# Patient Record
Sex: Female | Born: 1955 | Race: Black or African American | Hispanic: No | Smoking: Never smoker
Health system: Southern US, Community
[De-identification: ages and names within clinical notes are randomized; demographics above are authoritative.]

## PROBLEM LIST (undated history)

## (undated) HISTORY — PX: ABDOMINAL HYSTERECTOMY: SHX81

---

## 2009-04-17 ENCOUNTER — Emergency Department (HOSPITAL_BASED_OUTPATIENT_CLINIC_OR_DEPARTMENT_OTHER): Admission: EM | Admit: 2009-04-17 | Discharge: 2009-04-17 | Payer: Self-pay | Admitting: Emergency Medicine

## 2009-04-23 ENCOUNTER — Emergency Department (HOSPITAL_COMMUNITY): Admission: EM | Admit: 2009-04-23 | Discharge: 2009-04-23 | Payer: Self-pay | Admitting: Emergency Medicine

## 2009-12-09 ENCOUNTER — Emergency Department (HOSPITAL_COMMUNITY): Admission: EM | Admit: 2009-12-09 | Discharge: 2009-12-09 | Payer: Self-pay | Admitting: Emergency Medicine

## 2010-07-16 IMAGING — CR DG PELVIS 1-2V
1 series · 1 of 1 positions shown · non-contrast
Comparison: None

CLINICAL DATA: Chronic pelvic and lower back pain.

PELVIS - 1-2 VIEW

[t pelvis a.p.]
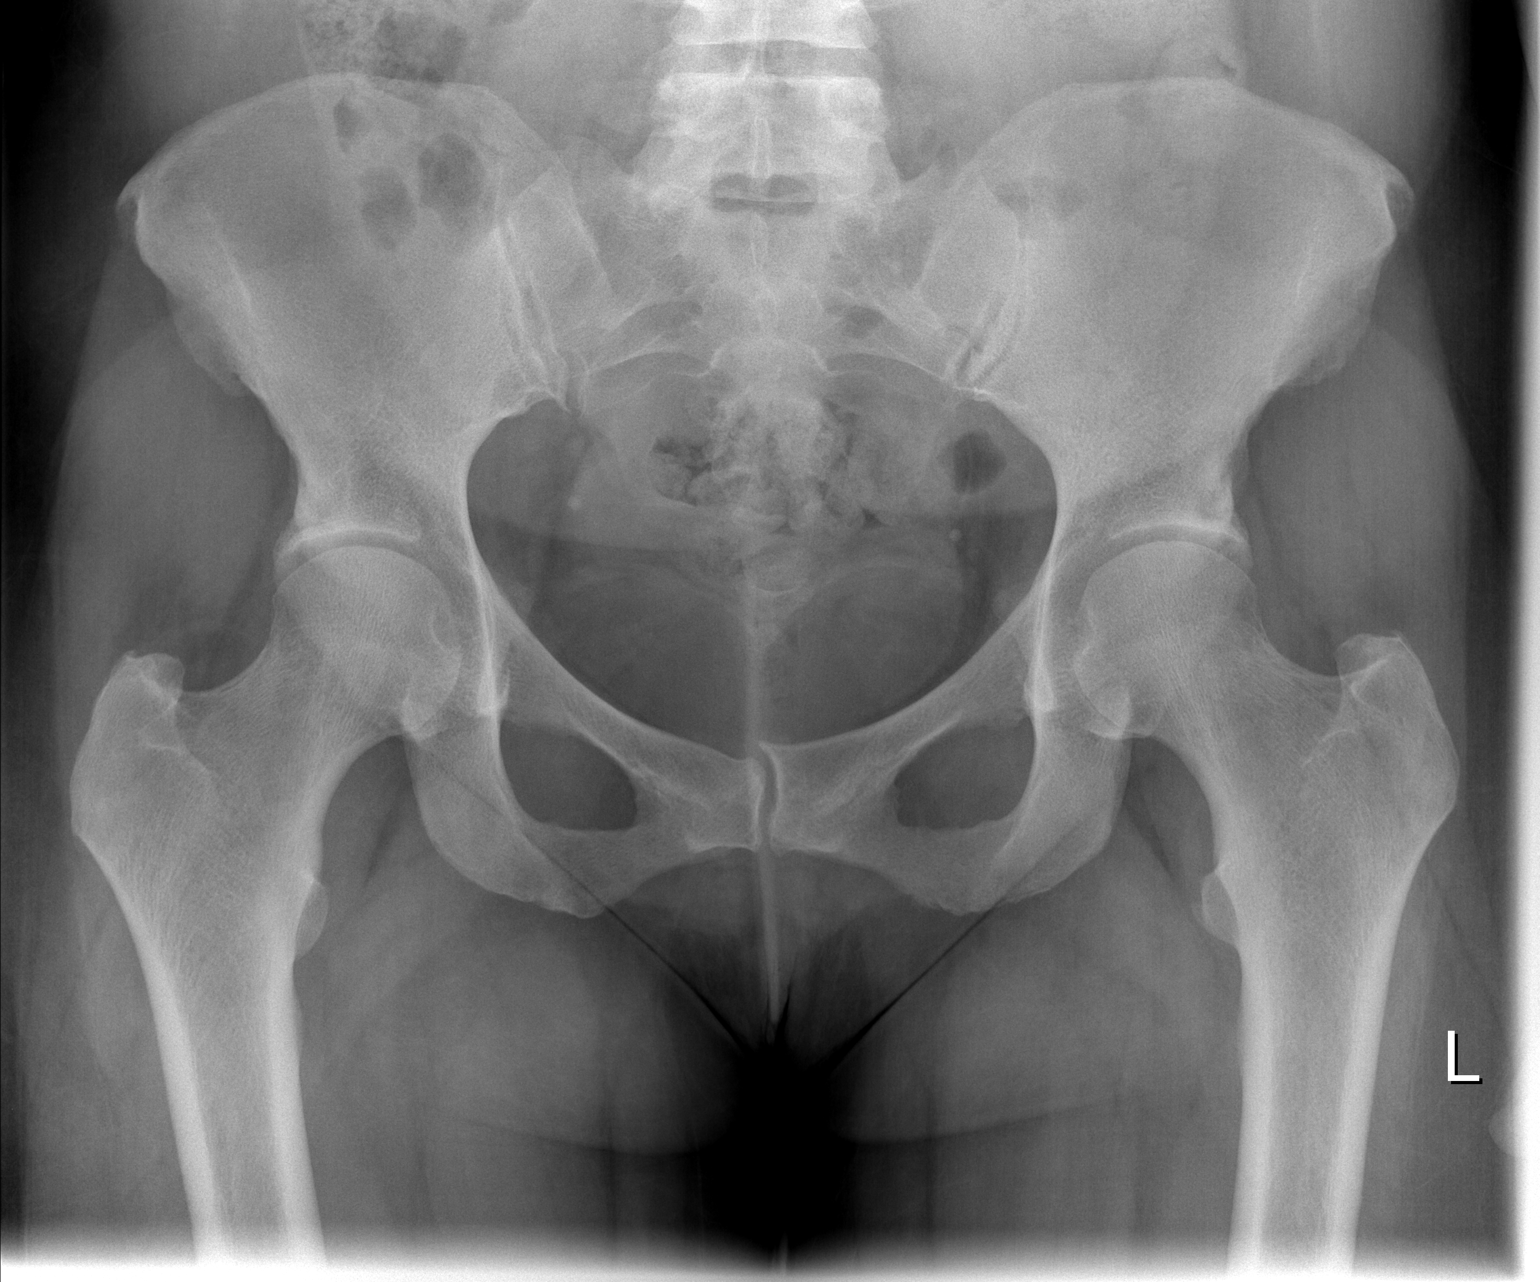

[1 of 1 positions shown; findings below may reference images not displayed]

FINDINGS: There is no evidence of fracture or dislocation.  Both
femoral heads are seated normally within their respective
acetabula.  The sacroiliac joints are unremarkable in appearance.

The visualized bowel gas pattern is grossly unremarkable in
appearance.  Scattered phleboliths are noted within the pelvis.
IMPRESSION: No evidence of fracture or dislocation.

## 2010-11-09 LAB — URINALYSIS, ROUTINE W REFLEX MICROSCOPIC
Bilirubin Urine: NEGATIVE
Glucose, UA: NEGATIVE mg/dL
Hgb urine dipstick: NEGATIVE
Ketones, ur: NEGATIVE mg/dL
Nitrite: NEGATIVE
Nitrite: NEGATIVE
Protein, ur: NEGATIVE mg/dL
Specific Gravity, Urine: 1.022 (ref 1.005–1.030)
pH: 5.5 (ref 5.0–8.0)
pH: 6.5 (ref 5.0–8.0)

## 2010-11-09 LAB — URINE MICROSCOPIC-ADD ON

## 2010-12-16 ENCOUNTER — Emergency Department (HOSPITAL_BASED_OUTPATIENT_CLINIC_OR_DEPARTMENT_OTHER)
Admission: EM | Admit: 2010-12-16 | Discharge: 2010-12-16 | Disposition: A | Discharge status: Home / Self Care | Payer: Self-pay | Attending: Emergency Medicine | Admitting: Emergency Medicine

## 2010-12-16 DIAGNOSIS — T6391XA Toxic effect of contact with unspecified venomous animal, accidental (unintentional), initial encounter: Secondary | ICD-10-CM | POA: Insufficient documentation

## 2010-12-16 DIAGNOSIS — T622X1A Toxic effect of other ingested (parts of) plant(s), accidental (unintentional), initial encounter: Secondary | ICD-10-CM | POA: Insufficient documentation

## 2014-01-03 ENCOUNTER — Encounter (HOSPITAL_COMMUNITY): Payer: Self-pay | Admitting: Emergency Medicine

## 2014-01-03 ENCOUNTER — Emergency Department (HOSPITAL_COMMUNITY): Payer: BC Managed Care – PPO

## 2014-01-03 ENCOUNTER — Emergency Department (HOSPITAL_COMMUNITY)
Admission: EM | Admit: 2014-01-03 | Discharge: 2014-01-03 | Disposition: A | Discharge status: Home / Self Care | Payer: BC Managed Care – PPO | Attending: Emergency Medicine | Admitting: Emergency Medicine

## 2014-01-03 DIAGNOSIS — M51369 Other intervertebral disc degeneration, lumbar region without mention of lumbar back pain or lower extremity pain: Secondary | ICD-10-CM

## 2014-01-03 DIAGNOSIS — IMO0002 Reserved for concepts with insufficient information to code with codable children: Secondary | ICD-10-CM | POA: Insufficient documentation

## 2014-01-03 DIAGNOSIS — M5136 Other intervertebral disc degeneration, lumbar region: Secondary | ICD-10-CM

## 2014-01-03 DIAGNOSIS — Z79899 Other long term (current) drug therapy: Secondary | ICD-10-CM | POA: Insufficient documentation

## 2014-01-03 DIAGNOSIS — M5126 Other intervertebral disc displacement, lumbar region: Secondary | ICD-10-CM

## 2014-01-03 DIAGNOSIS — Z91040 Latex allergy status: Secondary | ICD-10-CM | POA: Insufficient documentation

## 2014-01-03 DIAGNOSIS — M545 Low back pain, unspecified: Secondary | ICD-10-CM

## 2014-01-03 MED ORDER — OXYCODONE-ACETAMINOPHEN 5-325 MG PO TABS: 1.0000 | ORAL_TABLET | Freq: Four times a day (QID) | ORAL | Status: DC | PRN

## 2014-01-03 MED ORDER — METHOCARBAMOL 1000 MG/10ML IJ SOLN
500.0000 mg | Freq: Once | INTRAVENOUS | Status: DC
  Filled 2014-01-03: qty 5

## 2014-01-03 MED ORDER — PREDNISONE 20 MG PO TABS
60.0000 mg | ORAL_TABLET | Freq: Once | ORAL | Status: AC
  Administered 2014-01-03: 60 mg via ORAL
  Filled 2014-01-03: qty 3

## 2014-01-03 MED ORDER — METHOCARBAMOL 1000 MG/10ML IJ SOLN
500.0000 mg | Freq: Once | INTRAMUSCULAR | Status: DC
  Filled 2014-01-03: qty 5

## 2014-01-03 MED ORDER — METHOCARBAMOL 500 MG PO TABS: 500.0000 mg | ORAL_TABLET | Freq: Two times a day (BID) | ORAL | Status: DC

## 2014-01-03 MED ORDER — HYDROMORPHONE HCL PF 1 MG/ML IJ SOLN
1.0000 mg | Freq: Once | INTRAMUSCULAR | Status: AC
Start: 2014-01-03 — End: 2014-01-03
  Administered 2014-01-03: 1 mg via INTRAVENOUS
  Filled 2014-01-03: qty 1

## 2014-01-03 MED ORDER — POLYETHYLENE GLYCOL 3350 17 GM/SCOOP PO POWD: 17.0000 g | Freq: Every day | ORAL | Status: DC

## 2014-01-03 MED ORDER — METHOCARBAMOL 1000 MG/10ML IJ SOLN
500.0000 mg | Freq: Once | INTRAMUSCULAR | Status: AC
  Administered 2014-01-03: 500 mg via INTRAMUSCULAR

## 2014-01-03 MED ORDER — PREDNISONE 10 MG PO TABS: 20.0000 mg | ORAL_TABLET | Freq: Every day | ORAL | Status: DC

## 2014-01-03 NOTE — Discharge Instructions (Signed)
You were seen and evaluated for your low back pain. Your MRI today showed that you have a bulging disc in your lower back. Please use the medications prescribed to help with your symptoms and followup with a primary care provider or neurosurgery specialist for continued evaluation and treatment. Return if you have any changing or worsening symptoms.    Herniated Disk The bones of your spinal column (vertebrae) protect your spinal cord and nerves that go into your arms and legs. The vertebrae are separated by disks that cushion the spinal column and put space between your vertebrae. This allows movement between the vertebrae, which allows you to bend, rotate, and move your body from side to side. Sometimes, the disks move out of place (herniate) or break open (rupture) from injury or strain. The most common area for a disk herniation is in the lower back (lumbar area). Sometimes herniation occurs in the neck (cervical) disks.  CAUSES  As we grow older, the strong, fibrous cords that connect the vertebrae and support and surround the disks (ligaments) start to weaken. A strain on the back may cause a break in the disk ligaments. RISK FACTORS Herniated disks occur most often in men who are aged 18 years to 35 years, usually after strenuous activity. Other risk factors include conditions present at birth (congenital) that affect the size of the lumbar spinal canal. Additionally, a narrowing of the areas where the nerves exit the spinal canal can occur as you age. SYMPTOMS  Symptoms of a herniated disk vary. You may have weakness in certain muscles. This weakness can include difficulty lifting your leg or arm, difficulty standing on your toes on one side, or difficulty squeezing tightly with one of your hands. You may have numbness. You may feel a mild tingling, dull ache, or a burning or pulsating pain. In some cases, the pain is severe enough that you are unable to move. The pain most often occurs on one  side of the body. The pain often starts slowly. It may get worse:  After you sit or stand.  At night.  When you sneeze, cough, or laugh.  When you bend backwards or walk more than a few yards. The pain, numbness, or weakness will often go away or improve a lot over a period of weeks to months. Herniated lumbar disk Symptoms of a herniated lumbar disk may include sharp pain in one part of your leg, hip, or buttocks and numbness in other parts. You also may feel pain or numbness on the back of your calf or the top or sole of your foot. The same leg also may feel weak. Herniated cervical disk Symptoms of a herniated cervical disk may include pain when you move your neck, deep pain near or over your shoulder blade, or pain that moves to your upper arm, forearm, or fingers. DIAGNOSIS  To diagnose a herniated disk, your caregiver will perform a physical exam. Your caregiver also may perform diagnostic tests to see your disk or to test the reaction of your muscles and the function of your nerves. During the physical exam, your caregiver may ask you to:  Sit, stand, and walk. While you walk, your caregiver may ask you to try walking on your toes and then your heels.  Bend forward, backward, and sideways.  Raise your shoulders, elbow, wrist, and fingers and check your strength during these tasks. Your caregiver will check for:  Numbness or loss of feeling.  Muscle reflexes, which may be slower or  missing.  Muscle strength, which may be weaker.  Posture or the way your spine curves. Diagnostic tests that may be done include:  A spinal X-ray exam to rule out other causes of back pain.  Magnetic resonance imaging (MRI) or computed tomography (CT) scan, which will show if the herniated disk is pressing on your spinal canal.  Electromyography. This is sometimes used to identify the specific area of nerve involvement. TREATMENT  Initial treatment for a herniated disk is a short period of  rest with medicines for pain. Pain medicines can include nonsteroidal anti-inflammatory medicines (NSAIDs), muscle relaxants for back spasms, and (rarely) narcotic pain medicine for severe pain that does not respond to NSAID use. Bed rest is often limited to 1 or 2 days at the most because prolonged rest can delay recovery. When the herniation involves the lower back, sitting should be avoided as much as possible because sitting increases pressure on the ruptured disk. Sometimes a soft neck collar will be prescribed for a few days to weeks to help support your neck in the case of a cervical herniation. Physical therapy is often prescribed for patients with disk disease. Physical therapists will teach you how to properly lift, dress, walk, and perform other activities. They will work on strengthening the muscles that help support your spine. In some cases, physical therapy alone is not enough to treat a herniated disk. Steroid injections along the involved nerve root may be needed to help control pain. The steroid is injected in the area of the herniated disk and helps by reducing swelling around the disk. Sometimes surgery is the best option to treat a herniated disk.  SEEK IMMEDIATE MEDICAL CARE IF:   You have numbness, tingling, weakness, or problems with the use of your arms or legs.  You have severe headaches that are not relieved with the use of medicines.  You notice a change in your bowel or bladder control.  You have increasing pain in any areas of your body.  You experience shortness of breath, dizziness, or fainting. MAKE SURE YOU:   Understand these instructions.  Will watch your condition.  Will get help right away if you are not doing well or get worse. Document Released: 07/19/2000 Document Revised: 10/14/2011 Document Reviewed: 02/22/2011 [facility] Patient Information 2014 [place], [place].   Back Pain, Adult Back pain is very common. The pain often gets better over time. The  cause of back pain is usually not dangerous. Most people can learn to manage their back pain on their own.  HOME CARE   Stay active. Start with short walks on flat ground if you can. Try to walk farther each day.  Do not sit, drive, or stand in one place for more than 30 minutes. Do not stay in bed.  Do not avoid exercise or work. Activity can help your back heal faster.  Be careful when you bend or lift an object. Bend at your knees, keep the object close to you, and do not twist.  Sleep on a firm mattress. Lie on your side, and bend your knees. If you lie on your back, put a pillow under your knees.  Only take medicines as told by your doctor.  Put ice on the injured area.  Put ice in a plastic bag.  Place a towel between your skin and the bag.  Leave the ice on for 15-20 minutes, 03-04 times a day for the first 2 to 3 days. After that, you can switch between ice and  heat packs.  Ask your doctor about back exercises or massage.  Avoid feeling anxious or stressed. Find good ways to deal with stress, such as exercise. GET HELP RIGHT AWAY IF:   Your pain does not go away with rest or medicine.  Your pain does not go away in 1 week.  You have new problems.  You do not feel well.  The pain spreads into your legs.  You cannot control when you poop (bowel movement) or pee (urinate).  Your arms or legs feel weak or lose feeling (numbness).  You feel sick to your stomach (nauseous) or throw up (vomit).  You have belly (abdominal) pain.  You feel like you may pass out (faint). MAKE SURE YOU:   Understand these instructions.  Will watch your condition.  Will get help right away if you are not doing well or get worse. Document Released: 01/08/2008 Document Revised: 10/14/2011 Document Reviewed: 12/10/2010 [facility] Patient Information 2014 [place], [place].    Back Exercises Back exercises help treat and prevent back injuries. The goal is to increase your strength in  your belly (abdominal) and back muscles. These exercises can also help with flexibility. Start these exercises when told by your doctor. HOME CARE Back exercises include: Pelvic Tilt.  Lie on your back with your knees bent. Tilt your pelvis until the lower part of your back is against the floor. Hold this position 5 to 10 sec. Repeat this exercise 5 to 10 times. Knee to Chest.  Pull 1 knee up against your chest and hold for 20 to 30 seconds. Repeat this with the other knee. This may be done with the other leg straight or bent, whichever feels better. Then, pull both knees up against your chest. Sit-Ups or Curl-Ups.  Bend your knees 90 degrees. Start with tilting your pelvis, and do a partial, slow sit-up. Only lift your upper half 30 to 45 degrees off the floor. Take at least 2 to 3 seonds for each sit-up. Do not do sit-ups with your knees out straight. If partial sit-ups are difficult, simply do the above but with only tightening your belly (abdominal) muscles and holding it as told. Hip-Lift.  Lie on your back with your knees flexed 90 degrees. Push down with your feet and shoulders as you raise your hips 2 inches off the floor. Hold for 10 seconds, repeat 5 to 10 times. Back Arches.  Lie on your stomach. Prop yourself up on bent elbows. Slowly press on your hands, causing an arch in your low back. Repeat 3 to 5 times. Shoulder-Lifts.  Lie face down with arms beside your body. Keep hips and belly pressed to floor as you slowly lift your head and shoulders off the floor. Do not overdo your exercises. Be careful in the beginning. Exercises may cause you some mild back discomfort. If the pain lasts for more than 15 minutes, stop the exercises until you see your doctor. Improvement with exercise for back problems is slow.  Document Released: 08/24/2010 Document Revised: 10/14/2011 Document Reviewed: 05/23/2011 [facility] Patient Information 2014 [place], [place].

## 2014-01-03 NOTE — ED Notes (Signed)
Pt to ED with family with c/o low back pain, radiating down left hip, started this morning. Difficulty walking, states some incontinence of urine today only.

## 2014-01-03 NOTE — ED Provider Notes (Signed)
Beverly Reynolds 8:00 PM patient discussed in sign out. Patient with worsened low back pain complaining of some difficulty with urinating questionable incontinence versus decreased mobility from pain. Normal rectal tone. No other concerning deficits on exam. MRI pending to rule out cauda equina.  9:30PM pt feeling much better. She ahs been up standing and walking.    The patient also seen and evaluated with attending physician. Dr. Manus Gunning did speak with Dr. Dutch Quint with neurosurgery who has reviewed the MRI results. He does not believe there is any sign of a significant hematoma that the radiologist was questioning. He also feels that the left-sided pain and symptoms the patient is experiencing do not correlate well with the right sided bulging disc. He does not feel there are concerns for nerve impingement on the conus area causing her urinary urgency and dribbling. Patient does also state that she feels she is mostly having difficulty getting to the bathroom due to pain. At this time she is improved and stable to go home. Dr. Dutch Quint did recommend steroids and outpatient followup.    Angus Seller, PA-C 01/03/14 2214

## 2014-01-03 NOTE — ED Provider Notes (Signed)
CSN: [id number]     Arrival date & time 01/03/14  1332 History   First MD Initiated Contact with Patient 01/03/14 1605     Chief Complaint  Patient presents with  . Back Pain     (Consider location/radiation/quality/duration/timing/severity/associated sxs/prior Treatment) HPI Comments: Patient presents with a chief complaint of left lower back pain.  She reports that she began having the pain this morning.  She states that the pain radiates down the left leg.  She reports that she has a history of Sciatica, but reports that the pain she is having at this time is much worse.  She denies any injury or trauma.  She reports that she initially was having some tingling of her left foot for a few minutes, but no numbness or tingling at this time.  She denies any dysuria, hematuria, increased frequency, or urgency.  Denies any fever or chills.  She reports that this morning she did have an episode of urinary incontinence.  However, she reports that this was due to pain.  She states that she had the sensation that she had to urinate, but was walking slower to the bathroom due to the pain and had a small amount of incontinence.  She states that she was unable to hold her urine.  She denies bowel incontinence.  She denies any history of IV drug use or Cancer.  The history is provided by the patient.    History reviewed. No pertinent past medical history. Past Surgical History  Procedure Laterality Date  . Abdominal hysterectomy     No family history on file. History  Substance Use Topics  . Smoking status: Never Smoker   . Smokeless tobacco: Not on file  . Alcohol Use: No   OB History   Grav Para Term Preterm Abortions TAB SAB Ect Mult Living                 Review of Systems  All other systems reviewed and are negative.     Allergies  Latex  Home Medications   Prior to Admission medications   Medication Sig Start Date End Date Taking? Authorizing Provider  Probiotic Product  (PROBIOTIC DAILY PO) Take 1 tablet by mouth daily.   Yes Historical Provider, MD   BP 92/75  Pulse 67  Temp(Src) 98.4 F (36.9 C) (Oral)  Resp 18  SpO2 100% Physical Exam  Nursing note and vitals reviewed. Constitutional: She appears well-developed and well-nourished.  HENT:  Head: Normocephalic and atraumatic.  Mouth/Throat: Oropharynx is clear and moist.  Neck: Normal range of motion. Neck supple.  Cardiovascular: Normal rate, regular rhythm and normal heart sounds.   Pulmonary/Chest: Effort normal and breath sounds normal.  Abdominal: Soft. She exhibits no distension and no mass. There is no tenderness. There is no rebound and no guarding.  Genitourinary:  Normal rectal tone  Musculoskeletal: Normal range of motion.       Cervical back: She exhibits normal range of motion, no tenderness, no bony tenderness, no swelling, no edema and no deformity.       Thoracic back: She exhibits normal range of motion, no tenderness, no bony tenderness, no swelling, no edema and no deformity.       Lumbar back: She exhibits normal range of motion, no tenderness, no bony tenderness, no swelling, no edema and no deformity.  Neurological: She is alert. She has normal strength. No sensory deficit.  Reflex Scores:      Patellar reflexes are 2+ on the right  side and 2+ on the left side. Distal sensation of both feet intact   Skin: Skin is warm and dry. No erythema.  Psychiatric: She has a normal mood and affect.    ED Course  Procedures (including critical care time) Labs Review Labs Reviewed - No data to display  Imaging Review No results found.   EKG Interpretation None     6:15 PM Reassessed patient.  She reports mild improvement in her pain.  Pain worse with movement.  8:00 PM Patient signed out to Ivonne AndrewPeter Dammen, PA-C at shift change.  MRI lumbar spine pending.  MDM   Final diagnoses:  None   Patient presenting with acute onset of lower back pain that she began having this  morning.  She is afebrile.  No injury or trauma.  Distal sensation intact.  Muscle strength normal.  Patient complaining of questionable urinary incontinence.  Therefore, MRI lumbar spine ordered.  Results pending at shift change.      Santiago GladHeather Detrich Rakestraw, PA-C 01/04/14 [phone]  Santiago GladHeather Lilli Dewald, PA-C 01/04/14 [id number]

## 2014-01-04 NOTE — ED Provider Notes (Signed)
Medical screening examination/treatment/procedure(s) were conducted as a shared visit with non-physician practitioner(s) and myself.  I personally evaluated the patient during the encounter.  MRI results d/w Dr. Dutch Quint who viewed images.  R bulging disc is below conus and not cause of L sided symptoms. He does not feel patient's questionable urinary dribbling is related to this lesion.  She has equal strength in her lower extremities and is able to ambulate.    EKG Interpretation None       Glynn Octave, MD 01/04/14 [phone]

## 2014-01-04 NOTE — ED Provider Notes (Signed)
Medical screening examination/treatment/procedure(s) were conducted as a shared visit with non-physician practitioner(s) and myself.  I personally evaluated the patient during the encounter.  See my additional note  EKG Interpretation None        Glynn Octave, MD 01/04/14 1140

## 2015-10-11 ENCOUNTER — Emergency Department (HOSPITAL_BASED_OUTPATIENT_CLINIC_OR_DEPARTMENT_OTHER)
Admission: EM | Admit: 2015-10-11 | Discharge: 2015-10-11 | Disposition: A | Discharge status: Home / Self Care | Payer: BC Managed Care – PPO | Attending: Emergency Medicine | Admitting: Emergency Medicine

## 2015-10-11 ENCOUNTER — Encounter (HOSPITAL_BASED_OUTPATIENT_CLINIC_OR_DEPARTMENT_OTHER): Payer: Self-pay

## 2015-10-11 DIAGNOSIS — K644 Residual hemorrhoidal skin tags: Secondary | ICD-10-CM | POA: Insufficient documentation

## 2015-10-11 DIAGNOSIS — R195 Other fecal abnormalities: Secondary | ICD-10-CM | POA: Insufficient documentation

## 2015-10-11 DIAGNOSIS — K625 Hemorrhage of anus and rectum: Secondary | ICD-10-CM | POA: Diagnosis present

## 2015-10-11 DIAGNOSIS — Z91040 Latex allergy status: Secondary | ICD-10-CM | POA: Diagnosis not present

## 2015-10-11 LAB — CBC WITH DIFFERENTIAL/PLATELET
Basophils Absolute: 0 10*3/uL (ref 0.0–0.1)
Basophils Relative: 0 %
EOS ABS: 0 10*3/uL (ref 0.0–0.7)
EOS PCT: 1 %
HCT: 40.1 % (ref 36.0–46.0)
Hemoglobin: 13.4 g/dL (ref 12.0–15.0)
LYMPHS ABS: 3.1 10*3/uL (ref 0.7–4.0)
Lymphocytes Relative: 37 %
MCH: 26.1 pg (ref 26.0–34.0)
MCHC: 33.4 g/dL (ref 30.0–36.0)
MCV: 78.2 fL (ref 78.0–100.0)
MONO ABS: 0.6 10*3/uL (ref 0.1–1.0)
MONOS PCT: 7 %
Neutro Abs: 4.5 10*3/uL (ref 1.7–7.7)
Neutrophils Relative %: 55 %
PLATELETS: 247 10*3/uL (ref 150–400)
RBC: 5.13 MIL/uL — ABNORMAL HIGH (ref 3.87–5.11)
RDW: 14 % (ref 11.5–15.5)
WBC: 8.3 10*3/uL (ref 4.0–10.5)

## 2015-10-11 LAB — COMPREHENSIVE METABOLIC PANEL
ALT: 17 U/L (ref 14–54)
ANION GAP: 8 (ref 5–15)
AST: 19 U/L (ref 15–41)
Albumin: 4.1 g/dL (ref 3.5–5.0)
Alkaline Phosphatase: 57 U/L (ref 38–126)
BUN: 18 mg/dL (ref 6–20)
CHLORIDE: 105 mmol/L (ref 101–111)
CO2: 25 mmol/L (ref 22–32)
Calcium: 9.1 mg/dL (ref 8.9–10.3)
Creatinine, Ser: 0.99 mg/dL (ref 0.44–1.00)
Glucose, Bld: 88 mg/dL (ref 65–99)
Potassium: 3.6 mmol/L (ref 3.5–5.1)
SODIUM: 138 mmol/L (ref 135–145)
TOTAL PROTEIN: 7.7 g/dL (ref 6.5–8.1)
Total Bilirubin: 0.5 mg/dL (ref 0.3–1.2)

## 2015-10-11 LAB — OCCULT BLOOD X 1 CARD TO LAB, STOOL: Fecal Occult Bld: POSITIVE — AB

## 2015-10-11 MED ORDER — HYDROCORTISONE 2.5 % RE CREA: TOPICAL_CREAM | RECTAL | Status: AC

## 2015-10-11 NOTE — Discharge Instructions (Signed)
Use anusol cream twice daily .  Eat lots of fiber and fruits and vegetables. If you still are straining, take colace or other stool softeners.   You are likely going to have some minor bleeding.   See your doctor.   Return to ER if you have a lot of blood in stool, black stool, abdominal pain.    Hemorrhoids Hemorrhoids are puffy (swollen) veins around the rectum or anus. Hemorrhoids can cause pain, itching, bleeding, or irritation. HOME CARE  Eat foods with fiber, such as whole grains, beans, nuts, fruits, and vegetables. Ask your doctor about taking products with added fiber in them (fibersupplements).  Drink enough fluid to keep your pee (urine) clear or pale yellow.  Exercise often.  Go to the bathroom when you have the urge to poop. Do not wait.  Avoid straining to poop (bowel movement).  Keep the butt area dry and clean. Use wet toilet paper or moist paper towels.  Medicated creams and medicine inserted into the anus (anal suppository) may be used or applied as told.  Only take medicine as told by your doctor.  Take a warm water bath (sitz bath) for 15-20 minutes to ease pain. Do this 3-4 times a day.  Place ice packs on the area if it is tender or puffy. Use the ice packs between the warm water baths.  Put ice in a plastic bag.  Place a towel between your skin and the bag.  Leave the ice on for 15-20 minutes, 03-04 times a day.  Do not use a donut-shaped pillow or sit on the toilet for a long time. GET HELP RIGHT AWAY IF:   You have more pain that is not controlled by treatment or medicine.  You have bleeding that will not stop.  You have trouble or are unable to poop (bowel movement).  You have pain or puffiness outside the area of the hemorrhoids. MAKE SURE YOU:   Understand these instructions.  Will watch your condition.  Will get help right away if you are not doing well or get worse.   This information is not intended to replace advice given to  you by your health care provider. Make sure you discuss any questions you have with your health care provider.   Document Released: 04/30/2008 Document Revised: 07/08/2012 Document Reviewed: 06/02/2012 Elsevier Interactive Patient Education [employer]

## 2015-10-11 NOTE — ED Notes (Signed)
Pt reports episode of blood in stool x 1 today-NAD-steady gait

## 2015-10-11 NOTE — ED Provider Notes (Addendum)
CSN: [id number]     Arrival date & time 10/11/15  2014 History  By signing my name below, I, Phillis HaggisGabriella Gaje, attest that this documentation has been prepared under the direction and in the presence of Richardean Canalavid H Fabrizio Filip, MD. Electronically Signed: Phillis HaggisGabriella Gaje, ED Scribe. 10/11/2015. 10:01 PM.   Chief Complaint  Patient presents with  . Rectal Bleeding   The history is provided by the patient. No language interpreter was used.  HPI Comments: Beverly Reynolds is a 60 y.o. female who presents to the Emergency Department complaining of 1 episode of hematochezia onset 3 hours ago. Pt states that she had a normal brown BM and saw blood when wiping. She states that she did strain while having her BM today. She has not taken anything for her symptoms. She denies hx of similar symptoms, hx of hemorrhoids, use of blood thinners, nausea or vomiting.    History reviewed. No pertinent past medical history. Past Surgical History  Procedure Laterality Date  . Abdominal hysterectomy     No family history on file. Social History  Substance Use Topics  . Smoking status: Never Smoker   . Smokeless tobacco: None  . Alcohol Use: No   OB History    No data available     Review of Systems  Gastrointestinal: Positive for blood in stool.  All other systems reviewed and are negative.  Allergies  Latex  Home Medications   Prior to Admission medications   Not on File   BP 123/79 mmHg  Pulse 94  Temp(Src) 98.4 F (36.9 C) (Oral)  Resp 18  Ht 5\' 4"  (1.626 m)  Wt 214 lb (97.07 kg)  BMI 36.72 kg/m2  SpO2 98% Physical Exam  Constitutional: She is oriented to person, place, and time. She appears well-developed and well-nourished.  HENT:  Head: Normocephalic and atraumatic.  Eyes: Conjunctivae and EOM are normal. Pupils are equal, round, and reactive to light.  Neck: Normal range of motion. Neck supple.  Cardiovascular: Normal rate, regular rhythm and normal heart sounds.  Exam reveals no gallop and no  friction rub.   No murmur heard. Pulmonary/Chest: Effort normal and breath sounds normal. She has no wheezes.  Abdominal: Soft. There is no tenderness.  Genitourinary: Rectal exam shows external hemorrhoid.  Musculoskeletal: Normal range of motion.  Neurological: She is alert and oriented to person, place, and time.  Skin: Skin is warm and dry.  Psychiatric: She has a normal mood and affect. Her behavior is normal.  Nursing note and vitals reviewed.   ED Course  Procedures (including critical care time) DIAGNOSTIC STUDIES: Oxygen Saturation is 98% on RA, normal by my interpretation.    COORDINATION OF CARE: 9:57 PM-Discussed treatment plan which includes labs, hemorrhoid cream, and rectal exam with pt at bedside and pt agreed to plan.    Labs Review Labs Reviewed  CBC WITH DIFFERENTIAL/PLATELET - Abnormal; Notable for the following:    RBC 5.13 (*)    All other components within normal limits  COMPREHENSIVE METABOLIC PANEL  OCCULT BLOOD X 1 CARD TO LAB, STOOL  POC OCCULT BLOOD, ED    Imaging Review No results found. I have personally reviewed and evaluated these images and lab results as part of my medical decision-making.   EKG Interpretation None      MDM   Final diagnoses:  None   Beverly Reynolds is a 60 y.o. female here with rectal bleeding. Not on blood thinners. Has small hemorrhoid on exam. Not tachycardic. Hg stable.  occ mildly positive but brown stool. I think likely small hemorrhoid bleeding. Recommend anusol, stool softeners.    I personally performed the services described in this documentation, which was scribed in my presence. The recorded information has been reviewed and is accurate.    Richardean Canal, MD 10/11/15 2230  Richardean Canal, MD 10/11/15 [phone]

## 2015-10-11 NOTE — ED Notes (Signed)
C/o rectal bleed x 1 at 1915 this pm,  Bright red  Unsure of amount

## 2016-09-14 ENCOUNTER — Emergency Department (HOSPITAL_BASED_OUTPATIENT_CLINIC_OR_DEPARTMENT_OTHER)
Admission: EM | Admit: 2016-09-14 | Discharge: 2016-09-14 | Disposition: A | Discharge status: Home / Self Care | Payer: BC Managed Care – PPO | Attending: Emergency Medicine | Admitting: Emergency Medicine

## 2016-09-14 ENCOUNTER — Encounter (HOSPITAL_BASED_OUTPATIENT_CLINIC_OR_DEPARTMENT_OTHER): Payer: Self-pay | Admitting: *Deleted

## 2016-09-14 DIAGNOSIS — R05 Cough: Secondary | ICD-10-CM | POA: Diagnosis present

## 2016-09-14 DIAGNOSIS — J069 Acute upper respiratory infection, unspecified: Secondary | ICD-10-CM | POA: Insufficient documentation

## 2016-09-14 MED ORDER — BENZONATATE 100 MG PO CAPS: 100.0000 mg | ORAL_CAPSULE | Freq: Three times a day (TID) | ORAL | 0 refills | Status: AC

## 2016-09-14 MED ORDER — FLUTICASONE PROPIONATE 50 MCG/ACT NA SUSP: 2.0000 | Freq: Every day | NASAL | 0 refills | Status: AC

## 2016-09-14 MED ORDER — DM-GUAIFENESIN ER 30-600 MG PO TB12: 1.0000 | ORAL_TABLET | Freq: Two times a day (BID) | ORAL | 0 refills | Status: AC | PRN

## 2016-09-14 NOTE — Discharge Instructions (Signed)
1. Medications: flonase, mucinex, tessalon, usual home medications °2. Treatment: rest, drink plenty of fluids, take tylenol or ibuprofen for fever control °3. Follow Up: Please followup with your primary doctor in 3 days for discussion of your diagnoses and further evaluation after today's visit; if you do not have a primary care doctor use the resource guide provided to find one; Return to the ER for high fevers, difficulty breathing or other concerning symptoms  ° °Get help right away if: °You have severe or persistent: °Headache. °Ear pain. °Sinus pain. °Chest pain. °You have chronic lung disease and any of the following: °Wheezing. °Prolonged cough. °Coughing up blood. °A change in your usual mucus. °You have a stiff neck. °You have changes in your: °Vision. °Hearing. °Thinking. °Mood. °

## 2016-09-14 NOTE — ED Provider Notes (Signed)
[facility] Provider [facility]   CSN: [id number] Arrival date & time: 09/14/16  1038     History   Chief Complaint Chief Complaint  Patient presents with  . Cough    HPI Beverly ChacoDonna Reynolds is a 61 y.o. female presents to the emergency department complaining of productive cough for 3 days. She describes green sputum, no bloody in sputum. The patient has associated headache, body aches, chills, chest pain secondary to cough, diarrhea.  The symptoms have been  constant, gradually worsened.  Lying down makes the symptoms worse and sitting up makes symptoms better.  She states she tried theraflu. Ibuprofen with no relief. The patient denies urinary symptoms, or any other symptoms. She denies COPD, asthma, smoking. She denies getting a flu shot.    The history is provided by the patient. No language interpreter was used.  Cough  Associated symptoms include chest pain (secondary to cough), chills, rhinorrhea and myalgias. Pertinent negatives include no shortness of breath.    History reviewed. No pertinent past medical history.  There are no active problems to display for this patient.   Past Surgical History:  Procedure Laterality Date  . ABDOMINAL HYSTERECTOMY      OB History    No data available       Home Medications    Prior to Admission medications   Medication Sig Start Date End Date Taking? Authorizing Provider  benzonatate (TESSALON) 100 MG capsule Take 1 capsule (100 mg total) by mouth every 8 (eight) hours. 09/14/16   Milen Lengacher Manuel [place], [place]  dextromethorphan-guaiFENesin (MUCINEX DM) 30-600 MG 12hr tablet Take 1 tablet by mouth 2 (two) times daily as needed for cough. 09/14/16   Eyan Hagood Manuel [place], [place]  fluticasone (FLONASE) 50 MCG/ACT nasal spray Place 2 sprays into both nostrils daily. 09/14/16   Isobelle Tuckett [place], [place]  hydrocortisone (ANUSOL-HC) 2.5 % rectal cream Apply rectally 2 times daily 10/11/15   Charlynne Panderavid Hsienta Yao, MD    Family History No  family history on file.  Social History Social History  Substance Use Topics  . Smoking status: Never Smoker  . Smokeless tobacco: Never Used  . Alcohol use No     Allergies   Latex   Review of Systems Review of Systems  Constitutional: Positive for chills. Negative for fever.  HENT: Positive for rhinorrhea.   Respiratory: Positive for cough and chest tightness (secondary to cough). Negative for shortness of breath.   Cardiovascular: Positive for chest pain (secondary to cough).  Gastrointestinal: Positive for diarrhea and nausea. Negative for vomiting.  Genitourinary: Negative for difficulty urinating and dysuria.  Musculoskeletal: Positive for myalgias. Negative for neck pain and neck stiffness.  Skin: Negative for rash and wound.     Physical Exam Updated Vital Signs BP 126/82 (BP Location: Right Arm)   Pulse 82   Temp 98.5 F (36.9 C) (Oral)   Resp 20   Ht 5\' 4"  (1.626 m)   Wt 101 kg   SpO2 97%   BMI 38.23 kg/m   Physical Exam  Constitutional: She is oriented to person, place, and time. She appears well-developed and well-nourished.  Well appearing  HENT:  Head: Normocephalic and atraumatic.  Right Ear: External ear normal.  Left Ear: External ear normal.  Nose: Nose normal.  Mouth/Throat: Oropharynx is clear and moist. No oropharyngeal exudate.  Oropharynx without evidence of redness or exudates. Tonsils without evidence of redness, swelling, or exudates. TM's appear normal with no evidence of bulging. EAC appear non erythematous and  not swollen  Eyes: EOM are normal. Pupils are equal, round, and reactive to light.  Neck: Normal range of motion.  Normal ROM. No nuchal rigidity.   Cardiovascular: Normal rate and normal heart sounds.   Pulmonary/Chest: Effort normal and breath sounds normal. No respiratory distress. She has no wheezes. She has no rales.  Lungs CTA. No wheezing. No rales. No stridor. Normal work of breathing  Abdominal: Soft. There is no  tenderness. There is no rebound and no guarding.  Soft and nontender. No rebound. No guarding. Negative murphy's sign. No focal tenderness at McBurney's point. No CVA tenderness. No evidence of hernia  Neurological: She is alert and oriented to person, place, and time.  Skin: Skin is warm.  Psychiatric: She has a normal mood and affect. Her behavior is normal.  Nursing note and vitals reviewed.    ED Treatments / Results  Labs (all labs ordered are listed, but only abnormal results are displayed) Labs Reviewed - No data to display  EKG  EKG Interpretation None       Radiology No results found.  Procedures Procedures (including critical care time)  Medications Ordered in ED Medications - No data to display   Initial Impression / Assessment and Plan / ED Course  I have reviewed the triage vital signs and the nursing notes.  Pertinent labs & imaging results that were available during my care of the patient were reviewed by me and considered in my medical decision making (see chart for details).    Patients symptoms are consistent with URI, likely viral etiology. On exam, pt in NAD. VSS. No hypoxia. Afebrile. Lungs clear, Heart sounds clear. Normal work of breathing. TMs clear. Throat benign. Abdomen nontender/soft. Discussed that antibiotics are not indicated for viral infections. Pt will be discharged with symptomatic treatment.  Verbalizes understanding and is agreeable with plan. Pt is hemodynamically stable & in NAD prior to dc. Return precautions given.   Final Clinical Impressions(s) / ED Diagnoses   Final diagnoses:  Upper respiratory tract infection, unspecified type    New Prescriptions New Prescriptions   BENZONATATE (TESSALON) 100 MG CAPSULE    Take 1 capsule (100 mg total) by mouth every 8 (eight) hours.   DEXTROMETHORPHAN-GUAIFENESIN (MUCINEX DM) 30-600 MG 12HR TABLET    Take 1 tablet by mouth 2 (two) times daily as needed for cough.   FLUTICASONE (FLONASE)  50 MCG/ACT NASAL SPRAY    Place 2 sprays into both nostrils daily.     [address] [place], [place] 09/14/16 1219    Alvira Monday, MD 09/16/16 1606

## 2016-09-14 NOTE — ED Triage Notes (Signed)
Patient states she has a two day history of non productive cough, which is not associated with headache, body aches and chills.. States she has also had some diarrhea.   Denies fever.  Using OTC meds with no relief.

## 2018-03-14 ENCOUNTER — Encounter (HOSPITAL_BASED_OUTPATIENT_CLINIC_OR_DEPARTMENT_OTHER): Payer: Self-pay | Admitting: *Deleted

## 2018-03-14 ENCOUNTER — Emergency Department (HOSPITAL_BASED_OUTPATIENT_CLINIC_OR_DEPARTMENT_OTHER)
Admission: EM | Admit: 2018-03-14 | Discharge: 2018-03-14 | Disposition: A | Discharge status: Home / Self Care | Payer: BC Managed Care – PPO | Attending: Emergency Medicine | Admitting: Emergency Medicine

## 2018-03-14 ENCOUNTER — Other Ambulatory Visit: Payer: Self-pay

## 2018-03-14 DIAGNOSIS — Y929 Unspecified place or not applicable: Secondary | ICD-10-CM | POA: Insufficient documentation

## 2018-03-14 DIAGNOSIS — Z91040 Latex allergy status: Secondary | ICD-10-CM | POA: Insufficient documentation

## 2018-03-14 DIAGNOSIS — X58XXXA Exposure to other specified factors, initial encounter: Secondary | ICD-10-CM | POA: Insufficient documentation

## 2018-03-14 DIAGNOSIS — S61401A Unspecified open wound of right hand, initial encounter: Secondary | ICD-10-CM | POA: Insufficient documentation

## 2018-03-14 DIAGNOSIS — Y999 Unspecified external cause status: Secondary | ICD-10-CM | POA: Insufficient documentation

## 2018-03-14 DIAGNOSIS — Y939 Activity, unspecified: Secondary | ICD-10-CM | POA: Insufficient documentation

## 2018-03-14 DIAGNOSIS — Z79899 Other long term (current) drug therapy: Secondary | ICD-10-CM | POA: Insufficient documentation

## 2018-03-14 MED ORDER — CEPHALEXIN 500 MG PO CAPS: 500.0000 mg | ORAL_CAPSULE | Freq: Four times a day (QID) | ORAL | 0 refills | Status: AC

## 2018-03-14 NOTE — ED Triage Notes (Signed)
Piece of glass removed from pts palmar surface of hand 2 weeks ago-bleeding and swelling continues.

## 2018-03-14 NOTE — ED Notes (Addendum)
EDPA into room, at BS.  

## 2018-03-14 NOTE — ED Notes (Signed)
EDPA into room, prior to RN assessment, see PA notes, pending orders.   

## 2018-03-14 NOTE — Discharge Instructions (Signed)
Keep area covered with gauze padding and antibiotic ointment.  Antibiotics as directed to treat for any superficial infection.  Use Ace bandage to try and keep yourself from bumping the area, I suspect this may take some time to heal, it does not appear to require stitches.  Follow-up with your primary care doctor in the next few days for a wound check.  Return for fevers, surrounding redness, drainage, increasing pain or any other new or concerning symptoms.

## 2018-03-14 NOTE — ED Provider Notes (Addendum)
[facility] EMERGENCY DEPARTMENT Provider Note   CSN: [id number] Arrival date & time: 03/14/18  1936     History   Chief Complaint Chief Complaint  Patient presents with  . Hand Injury    HPI Beverly Reynolds is a 62 y.o. female.  Beverly Reynolds is a 62 y.o. Female who presents for evaluation of a wound to the palmar surface of her right hand.  Patient reports about 2 weeks ago she felt like she had a small piece of glass in the palm of the hand, so she dug the area out with tweezers, she got the glass out and no longer has any foreign body sensation, reports she just cannot get the wound to completely heal.  She feels like every time she uses her hand or bumps it on something the area that was starting to heal starts to bleed again.  Today when she was opening a jar the area started to bleed a lot causing her to come in.  She denies any purulent drainage, no surrounding redness or increasing pain.  No difficulty moving the hand, no numbness or tingling.  Patient has no history of diabetes.  Tetanus is up-to-date.     History reviewed. No pertinent past medical history.  There are no active problems to display for this patient.   Past Surgical History:  Procedure Laterality Date  . ABDOMINAL HYSTERECTOMY       OB History   None      Home Medications    Prior to Admission medications   Medication Sig Start Date End Date Taking? Authorizing Provider  benzonatate (TESSALON) 100 MG capsule Take 1 capsule (100 mg total) by mouth every 8 (eight) hours. 09/14/16   Alvina Chou, PA  cephALEXin (KEFLEX) 500 MG capsule Take 1 capsule (500 mg total) by mouth 4 (four) times daily. 03/14/18   Dartha Lodge, PA-C  dextromethorphan-guaiFENesin [facility] DM) 30-600 MG 12hr tablet Take 1 tablet by mouth 2 (two) times daily as needed for cough. 09/14/16   Alvina Chou, PA  fluticasone (FLONASE) 50 MCG/ACT nasal spray Place 2 sprays into both nostrils daily.  09/14/16   Alvina Chou, PA  hydrocortisone (ANUSOL-HC) 2.5 % rectal cream Apply rectally 2 times daily 10/11/15   Charlynne Pander, MD    Family History History reviewed. No pertinent family history.  Social History Social History   Tobacco Use  . Smoking status: Never Smoker  . Smokeless tobacco: Never Used  Substance Use Topics  . Alcohol use: No  . Drug use: No     Allergies   Latex   Review of Systems Review of Systems  Constitutional: Negative for chills and fever.  Musculoskeletal: Negative for arthralgias and myalgias.  Skin: Positive for wound. Negative for color change and rash.  Neurological: Negative for weakness and numbness.     Physical Exam Updated Vital Signs BP 121/65 (BP Location: Left Arm)   Pulse 74   Temp 98 F (36.7 C) (Oral)   Resp 18   Ht 5' 4.5" (1.638 m)   Wt 101.6 kg   SpO2 98%   BMI 37.86 kg/m   Physical Exam  Constitutional: She appears well-developed and well-nourished. No distress.  HENT:  Head: Normocephalic and atraumatic.  Eyes: Right eye exhibits no discharge. Left eye exhibits no discharge.  Pulmonary/Chest: Effort normal. No respiratory distress.  Musculoskeletal:  1 cm laceration to the palmar surface of the right hand, small amount of bleeding, wound appears to  be more of an ulceration, and there is no tissue amenable to repair, no surrounding erythema, no expressible purulence, no palpable foreign body.  Surrounding hand with sensation intact, normal grip strength.  2+ radial pulse and good capillary refill (see photo below)  Neurological: She is alert. Coordination normal.  Skin: Skin is warm and dry. Capillary refill takes less than 2 seconds. She is not diaphoretic.  Psychiatric: She has a normal mood and affect. Her behavior is normal.  Nursing note and vitals reviewed.      ED Treatments / Results  Labs (all labs ordered are listed, but only abnormal results are displayed) Labs Reviewed - No data  to display  EKG None  Radiology No results found.  Procedures Procedures (including critical care time)  Medications Ordered in ED Medications - No data to display   Initial Impression / Assessment and Plan / ED Course  I have reviewed the triage vital signs and the nursing notes.  Pertinent labs & imaging results that were available during my care of the patient were reviewed by me and considered in my medical decision making (see chart for details).  Patient presents for evaluation of wound to the right palm after she had a small piece of glass out of the palm 2 weeks ago.  Patient reports she successfully moved to the glass, but now this area does not want to completely heal, she reports with any bump or too much movement seems to start bleeding again.  On exam she has a 1 cm ulceration over the thenar eminence of the right palm with a small amount of bleeding.  Tissue does not appear to be amenable to suture repair.  Hand is neurovascularly intact.  No purulence or surrounding erythema.  Wound was washed out, covered in bacitracin and a bulky dressing with Ace wrap was applied for protection.  Will put patient on Keflex for any infection prophylaxis.  She is to follow-up with her PCP in 2 to 3 days for a wound check.  Return precautions discussed.   Final Clinical Impressions(s) / ED Diagnoses   Final diagnoses:  Open wound of right hand without foreign body, unspecified wound type, initial encounter    ED Discharge Orders         Ordered    cephALEXin (KEFLEX) 500 MG capsule  4 times daily     03/14/18 2203           Dartha LodgeFord, Jerami Tammen N, PA-C 03/14/18 2205    Dartha LodgeFord, Joellen Tullos N, PA-C 03/14/18 [id number]    Jacalyn LefevreHaviland, Julie, MD 03/15/18 1559

## 2018-03-14 NOTE — ED Notes (Signed)
Alert, NAD, calm, interactive, resps e/u, speaking in clear complete sentences, no dyspnea noted, skin W&D, initial VSS, BP elevated, here for R palmar wound/ulceration with TTP, and swelling, "not healing", onset ~2 weeks ago, has been oozing scant blood, no pus or obvious signs of infection including fever, heat, or pus, denies: other sx or wounds, pain, sob, fever, numbness, tingling, nausea, dizziness or visual changes).

## 2021-07-12 ENCOUNTER — Other Ambulatory Visit: Payer: Self-pay

## 2021-07-12 ENCOUNTER — Encounter (HOSPITAL_BASED_OUTPATIENT_CLINIC_OR_DEPARTMENT_OTHER): Payer: Self-pay | Admitting: *Deleted

## 2021-07-12 ENCOUNTER — Emergency Department (HOSPITAL_BASED_OUTPATIENT_CLINIC_OR_DEPARTMENT_OTHER): Payer: Commercial Managed Care - PPO

## 2021-07-12 ENCOUNTER — Emergency Department (HOSPITAL_BASED_OUTPATIENT_CLINIC_OR_DEPARTMENT_OTHER)
Admission: EM | Admit: 2021-07-12 | Discharge: 2021-07-12 | Disposition: A | Discharge status: Home / Self Care | Payer: Commercial Managed Care - PPO | Attending: Emergency Medicine | Admitting: Emergency Medicine

## 2021-07-12 DIAGNOSIS — Z91040 Latex allergy status: Secondary | ICD-10-CM | POA: Diagnosis not present

## 2021-07-12 DIAGNOSIS — M25462 Effusion, left knee: Secondary | ICD-10-CM | POA: Insufficient documentation

## 2021-07-12 DIAGNOSIS — M25562 Pain in left knee: Secondary | ICD-10-CM | POA: Insufficient documentation

## 2021-07-12 DIAGNOSIS — X509XXA Other and unspecified overexertion or strenuous movements or postures, initial encounter: Secondary | ICD-10-CM | POA: Diagnosis not present

## 2021-07-12 NOTE — ED Triage Notes (Signed)
Left knee "popped" while walking tonight. Painful.

## 2021-07-12 NOTE — Discharge Instructions (Addendum)
Your xray today was negative for fractures which is great. That being said, the xray is limited in what it can tell us - it is not able to look at the structures in the knee such as the tendons. Please follow up with ortho who can better evaluate it and determine the need for further imaging. For now, rest the leg, elevate it when resting and put ice on as much as you can.   Feel better!

## 2021-07-13 NOTE — ED Provider Notes (Signed)
[facility] EMERGENCY DEPARTMENT Provider Note   CSN: [id number] Arrival date & time: 07/12/21  2103     History No chief complaint on file.   Beverly Reynolds is a 65 y.o. female who presents for evaluation of knee pain that has been ongoing for a few weeks, however when she stood up and started walking tonight she felt a pop in her left knee with severe pain and some swelling.  She endorses difficulty walking, pain to palpation.  She has no numbness, no tingling.  She has no known trauma to the area.  She has been taking Tylenol and Motrin as needed for pain without any relief.  Denies prior history of knee injury or surgery.  She has no other complaints.  HPI     History reviewed. No pertinent past medical history.  There are no problems to display for this patient.   Past Surgical History:  Procedure Laterality Date   ABDOMINAL HYSTERECTOMY       OB History   No obstetric history on file.     No family history on file.  Social History   Tobacco Use   Smoking status: Never   Smokeless tobacco: Never  Substance Use Topics   Alcohol use: No   Drug use: No    Home Medications Prior to Admission medications   Medication Sig Start Date End Date Taking? Authorizing Provider  benzonatate (TESSALON) 100 MG capsule Take 1 capsule (100 mg total) by mouth every 8 (eight) hours. 09/14/16   Alvina Chou, PA  cephALEXin (KEFLEX) 500 MG capsule Take 1 capsule (500 mg total) by mouth 4 (four) times daily. 03/14/18   Dartha Lodge, PA-C  dextromethorphan-guaiFENesin [facility] DM) 30-600 MG 12hr tablet Take 1 tablet by mouth 2 (two) times daily as needed for cough. 09/14/16   Alvina Chou, PA  fluticasone (FLONASE) 50 MCG/ACT nasal spray Place 2 sprays into both nostrils daily. 09/14/16   Alvina Chou, PA  hydrocortisone (ANUSOL-HC) 2.5 % rectal cream Apply rectally 2 times daily 10/11/15   Charlynne Pander, MD    Allergies     Latex  Review of Systems   Review of Systems  Constitutional:  Negative for fever.  HENT: Negative.    Eyes: Negative.   Respiratory:  Negative for shortness of breath.   Cardiovascular: Negative.   Gastrointestinal:  Negative for abdominal pain and vomiting.  Endocrine: Negative.   Genitourinary: Negative.   Musculoskeletal:  Positive for arthralgias.  Skin:  Negative for rash.  Neurological:  Negative for headaches.  All other systems reviewed and are negative.  Physical Exam Updated Vital Signs BP 138/66 (BP Location: Left Arm)   Pulse 74   Temp 98.6 F (37 C) (Oral)   Resp 19   Ht 5' 4.5" (1.638 m)   Wt 104.3 kg   SpO2 99%   BMI 38.87 kg/m   Physical Exam Vitals and nursing note reviewed.  Constitutional:      General: She is not in acute distress.    Appearance: She is not ill-appearing.  HENT:     Head: Atraumatic.  Eyes:     Conjunctiva/sclera: Conjunctivae normal.  Cardiovascular:     Rate and Rhythm: Normal rate and regular rhythm.     Pulses: Normal pulses.     Heart sounds: No murmur heard. Pulmonary:     Effort: Pulmonary effort is normal. No respiratory distress.     Breath sounds: Normal breath sounds.  Abdominal:  General: Abdomen is flat. There is no distension.     Palpations: Abdomen is soft.     Tenderness: There is no abdominal tenderness.  Musculoskeletal:        General: Normal range of motion.     Cervical back: Normal range of motion.     Comments: T-spine and L-spine nontender to palpation at midline. Patient has decreased range of motion on passive and active knee flexion and extension secondary to pain.  There is some swelling of the left knee without bruising.  No palpable deformities, erythema, fluctuance or induration. All other joints supple and easily movable, no erythema, swelling or palpable deformity, all compartments soft.    Skin:    General: Skin is warm and dry.     Capillary Refill: Capillary refill takes less  than 2 seconds.  Neurological:     General: No focal deficit present.     Mental Status: She is alert.  Psychiatric:        Mood and Affect: Mood normal.    ED Results / Procedures / Treatments   Labs (all labs ordered are listed, but only abnormal results are displayed) Labs Reviewed - No data to display  EKG None  Radiology DG Knee Complete 4 Views Left  Result Date: 07/12/2021 CLINICAL DATA:  Evaluate for fracture. EXAM: LEFT KNEE - COMPLETE 4+ VIEW COMPARISON:  None. FINDINGS: No evidence of fracture, dislocation, or joint effusion. No evidence of arthropathy or other focal bone abnormality. Soft tissues are unremarkable. IMPRESSION: Negative. Electronically Signed   By: Darliss Cheney M.D.   On: 07/12/2021 21:34    Procedures Procedures   Medications Ordered in ED Medications - No data to display  ED Course  I have reviewed the triage vital signs and the nursing notes.  Pertinent labs & imaging results that were available during my care of the patient were reviewed by me and considered in my medical decision making (see chart for details).    MDM Rules/Calculators/A&P                         This is a 65 year old female who presents for evaluation of left knee pain with insidious onset that is exacerbated while walking earlier today.  Patient is unable to walk here in ED due to pain.  Passive and active range of motion limited due to pain.  There is some swelling of the left knee and tenderness to palpation anteriorly and on the medial aspect.  There is no warmth or erythema indicating infection or gout.  There is no posterior calf swelling, tenderness.  She has good distal pulses bilaterally.  X-ray was negative for fracture.  The emergent differential diagnosis of nontraumatic knee pain includes, but is not limited to arthritis, gout, bursitis, tendonitis, baker's cysti, septic joint, septic bursitis, joint effusion,  Referred pain.   Patient symptoms could possibly be  due to an injury to one of the tendons or meniscus, however that is not something that we would test for in ED.  From an emergent standpoint, she is safe to discharge home with outpatient follow-up with an orthopedic doctor.  Patient agrees and is amenable to plan.  I have given her an orthopedic referral, however patient states that she has an orthopedic referral from some of her friends that she will make an appoint with instead.  I have sent her home with Ace wrap bandage on the knee, some crutches to help her walk.  RICE protocol indicated.  All imaging was independently reviewed and interpreted by myself, Raynald Blend, PA-C. Final Clinical Impression(s) / ED Diagnoses Final diagnoses:  Knee pain, left anterior    Rx / DC Orders ED Discharge Orders     None        Janell Quiet, PA-C 07/13/21 1124    Rolan Bucco, MD 07/13/21 1505

## 2022-05-17 ENCOUNTER — Other Ambulatory Visit: Payer: Self-pay | Admitting: Family Medicine

## 2022-05-17 ENCOUNTER — Ambulatory Visit
Admission: RE | Admit: 2022-05-17 | Discharge: 2022-05-17 | Disposition: A | Discharge status: Home / Self Care | Payer: Commercial Managed Care - PPO | Source: Ambulatory Visit | Attending: Family Medicine | Admitting: Family Medicine

## 2022-05-17 DIAGNOSIS — M545 Low back pain, unspecified: Secondary | ICD-10-CM

## 2022-05-17 DIAGNOSIS — M25552 Pain in left hip: Secondary | ICD-10-CM

## 2022-05-17 DIAGNOSIS — M150 Primary generalized (osteo)arthritis: Secondary | ICD-10-CM

## 2022-07-01 ENCOUNTER — Telehealth: Payer: Self-pay | Admitting: Physician Assistant

## 2022-07-01 ENCOUNTER — Encounter: Payer: Self-pay | Admitting: Physician Assistant

## 2022-07-01 ENCOUNTER — Ambulatory Visit: Payer: Commercial Managed Care - PPO | Admitting: Physician Assistant

## 2022-07-01 ENCOUNTER — Ambulatory Visit (INDEPENDENT_AMBULATORY_CARE_PROVIDER_SITE_OTHER): Payer: Commercial Managed Care - PPO

## 2022-07-01 DIAGNOSIS — G8929 Other chronic pain: Secondary | ICD-10-CM

## 2022-07-01 DIAGNOSIS — M25562 Pain in left knee: Secondary | ICD-10-CM

## 2022-07-01 NOTE — Progress Notes (Signed)
Office Visit Note   Patient: Beverly Reynolds           Date of Birth: September 03, 1955           MRN: [id number] Visit Date: 07/01/2022              Requested by: No referring provider defined for this encounter. PCP: System, Provider Not In   Assessment & Plan: Visit Diagnoses:  1. Chronic pain of left knee     Plan: Pleasant 66 year old woman with a 1 year history of left posterior medial knee pain.  This did have an incident when she stepped up on a stool and felt a pop on the medial side of her knee.  She did not really have much trouble with her knee in the past.  She was scheduled for meniscus surgery with [facility] but they required a large amount of money upfront.  She has not gotten any better and is using a crutch to ambulate.  She did have an MRI of which I do not have access to.  She said the surgery was based on this MRI and it was to "fix the meniscus ".  Her exam does demonstrate some posterior medial knee pain.  To palpation.  She does have some arthritic changes and some narrowing over the medial joint line as well but she did not have an improvement with an injection.  She has no effusion today.  No real tenderness laterally.  She does have some degenerative changes of the patellofemoral joint again not painful to her today.  She is going to get the MRI and the report which I emphasized is important and drop it off here.  Once I review that I will review her to see one of our knee doctors for consideration of an arthroscopy which she wants to go forward with.  The question would be is how much arthritis that she have in the medial compartment of the knee although she did not have any symptoms prior to her injury  Follow-Up Instructions: Return After MRI received from [facility].   Orders:  Orders Placed This Encounter  Procedures   XR KNEE 3 VIEW LEFT   No orders of the defined types were placed in this encounter.     Procedures: No procedures  performed   Clinical Data: No additional findings.   Subjective: Chief Complaint  Patient presents with   Left Knee - Pain    HPI Beverly Reynolds is a pleasant 60 year old woman with a 1 year history of left knee pain.  She states that she was climbing up on a stool to reach something off an upper shelf at her home.  As she lifted her left leg she felt a loud pop and had pain associated with it.  This did not improve she was seen and evaluated by [facility].  She says she did have an MRI of her left knee which I do not have access to today.  She did say that she was scheduled for surgery to "fix my meniscus".  Unfortunately they required a large amount of money upfront to go forward with surgery which she did not have at the time.  The pain in her left knee has continued and is focal over the posterior medial joint line.  She is currently using a crutch as it is that painful.  Prior to this incident she says she did not really have any problem with her left knee.  She did  try physical therapy which did not help.  She also tried an injection which did not help her at all.  Review of Systems  All other systems reviewed and are negative.    Objective: Vital Signs: There were no vitals taken for this visit.  Physical Exam Constitutional:      Appearance: Normal appearance.  Pulmonary:     Effort: Pulmonary effort is normal.  Skin:    General: Skin is warm and dry.  Neurological:     Mental Status: She is alert.     Ortho Exam Left knee she is ambulating with a crutch.  No effusion no redness no erythema.  She has good varus valgus stability good anterior draw with a good endpoint.  She does have tenderness over the posterior medial joint line mild tenderness over the lateral joint line. Specialty Comments:  No specialty comments available.  Imaging: XR KNEE 3 VIEW LEFT  Result Date: 07/01/2022 Three-view radiographs of her left knee were obtained today.  She does have  some narrowing of the medial side of her knee joint.  Also some degenerative changes of the patellofemoral joint overall well-preserved spacing in the lateral joint no acute findings    PMFS History: There are no problems to display for this patient.  History reviewed. No pertinent past medical history.  History reviewed. No pertinent family history.  Past Surgical History:  Procedure Laterality Date   ABDOMINAL HYSTERECTOMY     Social History   Occupational History   Not on file  Tobacco Use   Smoking status: Never   Smokeless tobacco: Never  Substance and Sexual Activity   Alcohol use: No   Drug use: No   Sexual activity: Not on file

## 2022-07-01 NOTE — Telephone Encounter (Signed)
Patient called. Would like to know if [facility] could not charge her for the X-ray today? Her call back number is [phone]

## 2022-07-12 ENCOUNTER — Ambulatory Visit: Payer: Commercial Managed Care - PPO | Admitting: Orthopaedic Surgery

## 2022-07-12 ENCOUNTER — Encounter: Payer: Self-pay | Admitting: Orthopaedic Surgery

## 2022-07-12 DIAGNOSIS — S83242A Other tear of medial meniscus, current injury, left knee, initial encounter: Secondary | ICD-10-CM

## 2022-07-12 NOTE — Progress Notes (Signed)
Office Visit Note   Patient: Beverly Reynolds           Date of Birth: [birth year]           MRN: [id number] Visit Date: 07/12/2022              Requested by: No referring provider defined for this encounter. PCP: System, Provider Not In   Assessment & Plan: Visit Diagnoses:  1. Acute medial meniscus tear of left knee, initial encounter     Plan: Impression is left knee acute medial meniscal tear.  I reviewed the MRI report as well as the CD.  She has moderate chondromalacia.  Based on these findings I have also recommended a partial medial meniscectomy.  We can arrange for the surgery to be done at [facility] day surgery center.  Questions encouraged and answered. Impression is left medial meniscus tear.  At this point, conservative treatments fail to provide any significant relief and the pain is severely affecting ADLs and quality of life.  Based on treatment options, the patient has elected to move forward with left knee arthroscopy with partial medial menisectomy and debridements as indicated.  We have discussed the surgical risks that include but are not limited to infection, neurovascular injury, incomplete relief of pain.  Recovery and prognosis were also reviewed.  Beverly Reynolds will call the patient to schedule surgery.  She would prefer something on a Friday in January 2024.  Current anticoagulants: No antithrombotic Postop anticoagulation: None Diabetic: No  Prior DVT/PE: No Tobacco use: No Clearances needed for surgery: None Anticipated discharge dispo: outpatient surgery  Follow-Up Instructions: No follow-ups on file.   Orders:  No orders of the defined types were placed in this encounter.  No orders of the defined types were placed in this encounter.     Procedures: No procedures performed   Clinical Data: No additional findings.   Subjective: Chief Complaint  Patient presents with   Left Knee - Pain    HPI Beverly Reynolds is a 66 year old female referral from [facility]  for left knee medial meniscal tear.  Underwent MRI in January 2023 which confirmed complex tear of the medial meniscus.  She saw Dr. Turner Daniels at [facility] and the original plan was for knee arthroscopy but unfortunately she was unable to afford the upfront cost of the surgery at the surgical center therefore she canceled her surgery.  In the meantime she is been using a crutch and has tried physical therapy, gabapentin, Flexeril, Tylenol.  Unfortunately her symptoms persist.  Review of Systems  Constitutional: Negative.   HENT: Negative.    Eyes: Negative.   Respiratory: Negative.    Cardiovascular: Negative.   Endocrine: Negative.   Musculoskeletal: Negative.   Neurological: Negative.   Hematological: Negative.   Psychiatric/Behavioral: Negative.    All other systems reviewed and are negative.    Objective: Vital Signs: There were no vitals taken for this visit.  Physical Exam Vitals and nursing note reviewed.  Constitutional:      Appearance: She is well-developed.  HENT:     Head: Normocephalic and atraumatic.  Pulmonary:     Effort: Pulmonary effort is normal.  Abdominal:     Palpations: Abdomen is soft.  Musculoskeletal:     Cervical back: Neck supple.  Skin:    General: Skin is warm.     Capillary Refill: Capillary refill takes less than 2 seconds.  Neurological:     Mental Status: She is alert and oriented to person, place,  and time.  Psychiatric:        Behavior: Behavior normal.        Thought Content: Thought content normal.        Judgment: Judgment normal.     Ortho Exam Examination of the left knee show significant medial joint line tenderness and pain with McMurray's maneuver.  There is no joint effusion.  Collaterals and cruciates are stable. Specialty Comments:  No specialty comments available.  Imaging: No results found.   PMFS History: Patient Active Problem List   Diagnosis Date Noted   Acute medial meniscus tear of left knee  07/12/2022   History reviewed. No pertinent past medical history.  History reviewed. No pertinent family history.  Past Surgical History:  Procedure Laterality Date   ABDOMINAL HYSTERECTOMY     Social History   Occupational History   Not on file  Tobacco Use   Smoking status: Never   Smokeless tobacco: Never  Substance and Sexual Activity   Alcohol use: No   Drug use: No   Sexual activity: Not on file

## 2022-07-25 ENCOUNTER — Telehealth: Payer: Self-pay | Admitting: Orthopaedic Surgery

## 2022-07-25 NOTE — Telephone Encounter (Signed)
Called patient to offer surgery dates available for left knee partial medial meniscectomy.  Left message on voicemail for patient to return call.    Patient called back and left voicemail stating she would like to talk to Dr. Roda Shutters again before scheduling surgery.  I called patient back to get more details and got voicemail again.  Patient's cell  [phone]

## 2022-08-09 ENCOUNTER — Telehealth: Payer: Self-pay | Admitting: Orthopaedic Surgery

## 2022-08-09 NOTE — Telephone Encounter (Signed)
Left message on patient's voicemail asking her return call  to discuss surgery date for knee scope if she is still interested in having the procedure.  Provided name and direct call back number.

## 2024-03-13 LAB — AMB RESULTS CONSOLE CBG: Glucose: 126

## 2024-03-13 NOTE — Progress Notes (Signed)
No SDOH needs

## 2024-05-07 NOTE — Progress Notes (Signed)
 The patient attended a screening event on 03/13/2024 where her screening results were a BP of 128/72. At the event the patient did not indicate any SDOH needs.    Per chart review the patient has a PCP not in the system, has [employer], and does not have any SDOH needs. There are no [facility] visible encounters within the last 12 months.  CHW attempted to reach patient (9/29, 9/30) and was able to get a hold of her (10/3). Patient stated that at this time she does not have a PCP or insurance. She decided to get rid of her insurance with her employer to save money and has not seen a PCP due to not having insurance. Patient stated she will be getting insurance by the end of this month through her employer during the open enrollment period. Patient does not have any current SDOH needs. Patient declined insurance resources but would like resources in regards to finding a PCP. Letter sent with [employer] and [facility] PCP resource flyers. An additional follow up will be done in according to the health equity team's protocol.

## 2024-07-15 NOTE — Progress Notes (Addendum)
 Pt attended screening event on 03/13/2024 where her BP was 128/72. Pt documented having a PCP [facility], insurance through [place]. Pt did not indicate any SDOH needs at the time of the event.  Per chart review pt has insurance now through [facility], and has a PCP listed as provider not in the system. Pt has no [facility] visible encounters in the past .  Further chart review indicates that on initial Health [facility]ity follow up, CHW Lacretia M.) stated that she was able to contact pt and pt stated at that time that she was looking for a new provider and wanted resources of providers in her area. CHW was able to send pt the [employer] flyer.   This CHW called pt to follow up on PCP status, VM left asking pt to call back at her earliest convenience.   Pt called CHW back on 07/15/2024 at 11:07am. Pt stated that she does not need any PCP resources and that she is back with her PCP Dr. Benjamine.   No future follow up to be scheduled per [facility] Equity protocol.
